# Patient Record
Sex: Female | Born: 1990 | Race: White | Hispanic: No | Marital: Single | Smoking: Never smoker
Health system: Southern US, Community
[De-identification: ages and names within clinical notes are randomized; demographics above are authoritative.]

## PROBLEM LIST (undated history)

## (undated) HISTORY — PX: WISDOM TOOTH EXTRACTION: SHX21

---

## 2020-03-05 ENCOUNTER — Other Ambulatory Visit: Payer: Self-pay

## 2020-03-05 ENCOUNTER — Emergency Department: Payer: Medicaid Other

## 2020-03-05 ENCOUNTER — Emergency Department
Admission: EM | Admit: 2020-03-05 | Discharge: 2020-03-05 | Disposition: A | Discharge status: Home / Self Care | Payer: Medicaid Other | Attending: Emergency Medicine | Admitting: Emergency Medicine

## 2020-03-05 ENCOUNTER — Encounter: Payer: Self-pay | Admitting: Emergency Medicine

## 2020-03-05 DIAGNOSIS — M25511 Pain in right shoulder: Secondary | ICD-10-CM

## 2020-03-05 DIAGNOSIS — W06XXXA Fall from bed, initial encounter: Secondary | ICD-10-CM | POA: Insufficient documentation

## 2020-03-05 DIAGNOSIS — S0990XA Unspecified injury of head, initial encounter: Secondary | ICD-10-CM | POA: Diagnosis not present

## 2020-03-05 DIAGNOSIS — S4991XA Unspecified injury of right shoulder and upper arm, initial encounter: Secondary | ICD-10-CM | POA: Diagnosis present

## 2020-03-05 DIAGNOSIS — S42021A Displaced fracture of shaft of right clavicle, initial encounter for closed fracture: Secondary | ICD-10-CM | POA: Insufficient documentation

## 2020-03-05 MED ORDER — ONDANSETRON 4 MG PO TBDP: 4.0000 mg | ORAL_TABLET | Freq: Three times a day (TID) | ORAL | 0 refills | Status: AC | PRN

## 2020-03-05 MED ORDER — OXYCODONE-ACETAMINOPHEN 5-325 MG PO TABS: 1.0000 | ORAL_TABLET | Freq: Four times a day (QID) | ORAL | 0 refills | Status: AC | PRN

## 2020-03-05 NOTE — ED Triage Notes (Signed)
Pt to ED via POV, ambulatory to triage desk with steady gait. Pt reports fell at approx 0300, c/o R shoulder pain at this time. Pt also states hit her face when she fell. Pt denies visual changes from fall. Pt denies LOC from fall.

## 2020-03-05 NOTE — ED Provider Notes (Signed)
[facility]-EMERGENCY DEPARTMENT  ____________________________________________  Time seen: Approximately 10:46 AM  I have reviewed the triage vital signs and the nursing notes.   HISTORY  Chief Complaint Fall and Shoulder Injury (/)   Historian Patient     HPI Kerri Wright is a 30 y.o. female presents to the emergency department with right shoulder pain and headache.  Patient reports that her and her husband were [place] to the bathroom last night and she fell out of bed and hit her head on the concrete floor.  She sustained a subconjunctival hemorrhage on the left.  Patient is also complaining of right shoulder pain over the right clavicle.  She denies loss of consciousness.  No neck pain.  No numbness or tingling in the upper and lower extremities.  No chest pain, chest tightness or abdominal pain.  Patient has been able to ambulate since injury occurred but has difficulty performing overhead activities for the right upper extremity.   History reviewed. No pertinent past medical history.   Immunizations up to date:  Yes.     History reviewed. No pertinent past medical history.  There are no problems to display for this patient.   Past Surgical History:  Procedure Laterality Date  . CESAREAN SECTION    . WISDOM TOOTH EXTRACTION      Prior to Admission medications   Not on File    Allergies Vicodin hp [hydrocodone-acetaminophen]  No family history on file.  Social History Social History   Tobacco Use  . Smoking status: Never Smoker  . Smokeless tobacco: Never Used  Substance Use Topics  . Alcohol use: Yes  . Drug use: Not Currently     Review of Systems  Constitutional: No fever/chills Eyes:  No discharge ENT: No upper respiratory complaints. Respiratory: no cough. No SOB/ use of accessory muscles to breath Gastrointestinal:   No nausea, no vomiting.  No diarrhea.  No constipation. Musculoskeletal: Patient has right shoulder pain.  Skin: Negative for  rash, abrasions, lacerations, ecchymosis.    ____________________________________________   PHYSICAL EXAM:  VITAL SIGNS: ED Triage Vitals  Enc Vitals Group     BP 03/05/20 0853 120/85     Pulse Rate 03/05/20 0853 98     Resp 03/05/20 0853 18     Temp 03/05/20 0853 98.3 F (36.8 C)     Temp Source 03/05/20 0853 Oral     SpO2 03/05/20 0853 98 %     Weight 03/05/20 0846 170 lb (77.1 kg)     Height 03/05/20 0846 5\' 2"  (1.575 m)     Head Circumference --      Peak Flow --      Pain Score 03/05/20 0846 7     Pain Loc --      Pain Edu? --      Excl. in GC? --      Constitutional: Alert and oriented. Well appearing and in no acute distress. Eyes: Patient has subconjunctival hemorrhage on the left.  PERRL. EOMI. Head: Atraumatic. ENT:      Nose: No congestion/rhinnorhea.      Mouth/Throat: Mucous membranes are moist.  Neck: No stridor.  Full range of motion.  No midline C-spine tenderness to palpation. Cardiovascular: Normal rate, regular rhythm. Normal S1 and S2.  Good peripheral circulation. Respiratory: Normal respiratory effort without tachypnea or retractions. Lungs CTAB. Good air entry to the bases with no decreased or absent breath sounds Gastrointestinal: Bowel sounds x 4 quadrants. Soft and nontender to palpation. No guarding or rigidity.  No distention. Musculoskeletal: Patient is unable to perform full range of motion at the right shoulder.  She does have tenderness to palpation over the right clavicle.  Palpable radial pulse, right.  Patient can spread her right fingers can perform an okay sign.  Neurologic:  Normal for age. No gross focal neurologic deficits are appreciated.  Skin:  Skin is warm, dry and intact. No rash noted. Psychiatric: Mood and affect are normal for age. Speech and behavior are normal.   ____________________________________________   LABS (all labs ordered are listed, but only abnormal results are displayed)  Labs Reviewed - No data to  display ____________________________________________  EKG   ____________________________________________  RADIOLOGY Geraldo Pitter, personally viewed and evaluated these images (plain radiographs) as part of my medical decision making, as well as reviewing the written report by the radiologist.  DG Shoulder Right  Result Date: 03/05/2020 CLINICAL DATA:  Fall while running.  Right shoulder pain. EXAM: RIGHT SHOULDER - 2+ VIEW COMPARISON:  None. FINDINGS: Right shoulder is located without a fracture. There appears to be a mildly displaced fracture involving the mid right clavicle. Visualized ribs are intact. IMPRESSION: Mildly displaced fracture involving the right clavicle. This may be better characterized with dedicated clavicle images. No acute abnormality to the right shoulder. Electronically Signed   By: Richarda Overlie M.D.   On: 03/05/2020 09:42   CT Head Wo Contrast  Result Date: 03/05/2020 CLINICAL DATA:  30 year old female status post fall at 0300 hours. Pain. Sinuses EXAM: CT HEAD WITHOUT CONTRAST TECHNIQUE: Contiguous axial images were obtained from the base of the skull through the vertex without intravenous contrast. COMPARISON:  None. FINDINGS: Brain: Normal noncontrast CT appearance of the brain. Normal cerebral volume. No midline shift, ventriculomegaly, mass effect, evidence of mass lesion, intracranial hemorrhage or evidence of cortically based acute infarction. Gray-white matter differentiation is within normal limits throughout the brain. Vascular: No suspicious intracranial vascular hyperdensity. Skull: Negative.  No fracture identified. Sinuses/Orbits: Visualized paranasal sinuses and mastoids are clear. Other: No orbit or scalp soft tissue injury identified. IMPRESSION: Normal noncontrast Head CT.   No acute traumatic injury identified. Electronically Signed   By: Odessa Fleming M.D.   On: 03/05/2020 10:31     ____________________________________________    PROCEDURES  Procedure(s) performed:     Procedures     Medications - No data to display   ____________________________________________   INITIAL IMPRESSION / ASSESSMENT AND PLAN / ED COURSE  Pertinent labs & imaging results that were available during my care of the patient were reviewed by me and considered in my medical decision making (see chart for details).      Assessment and plan Right shoulder pain 30 year old female presents to the emergency department with acute right shoulder pain and headache after patient fell from her bed.  Vital signs were reassuring at triage.  On physical exam, patient was alert, active and nontoxic-appearing with no neuro deficits.  She did have tenderness to palpation over the right clavicle.  X-ray of the right shoulder indicates a nondisplaced right clavicle fracture that is mildly angulated.  Patient was placed in a sling for comfort.  CT head reveals no evidence of intracranial bleed or skull fracture.  Patient was prescribed a short course of Percocet for pain.     ____________________________________________  FINAL CLINICAL IMPRESSION(S) / ED DIAGNOSES  Final diagnoses:  Acute pain of right shoulder      NEW MEDICATIONS STARTED DURING THIS VISIT:  ED Discharge Orders  None          This chart was dictated using voice recognition software/Dragon. Despite best efforts to proofread, errors can occur which can change the meaning. Any change was purely unintentional.     Orvil Feil, PA-C 03/05/20 1053    Minna Antis, MD 03/05/20 [phone]

## 2022-05-30 IMAGING — CT CT HEAD W/O CM
4 series · 16 of 47 positions shown, 18 images · non-contrast
Comparison: None.

CLINICAL DATA: 29-year-old female status post fall at 3633 hours.
Pain. Sinuses

EXAM:
CT HEAD WITHOUT CONTRAST
TECHNIQUE: Contiguous axial images were obtained from the base of the skull
through the vertex without intravenous contrast.

[Series 2: head bone · axial · 0.40mm/px · z∈[-128,-98]mm · 3 of 75 slices shown]
[im 8/75  bone]
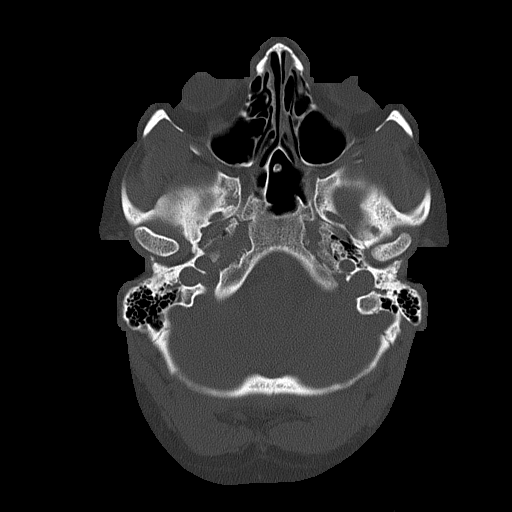
[im 15/75  bone]
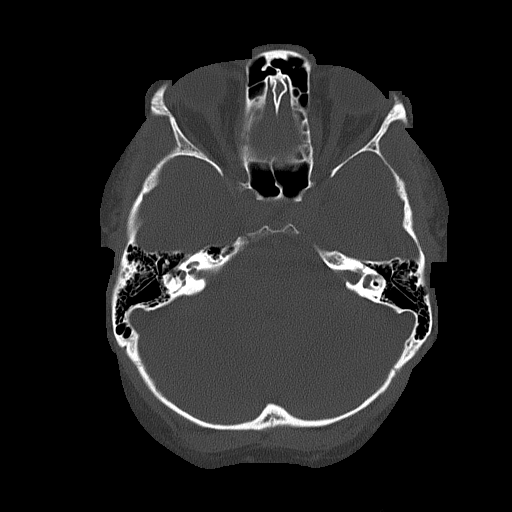
[im 23/75  bone]
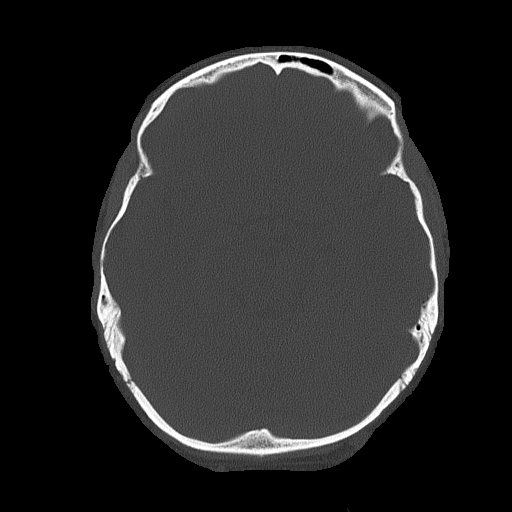

[Series 3: head wo · axial · 0.40mm/px · z∈[-127,-17]mm · 7 of 30 slices shown, 9 images]
[im 4/30  brain]
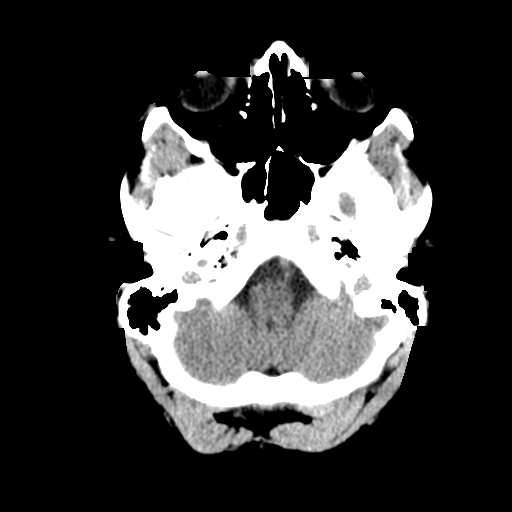
[im 4/30  bone]
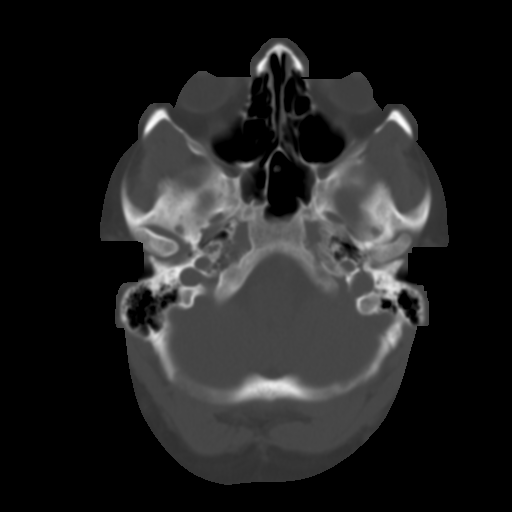
[im 8/30  brain]
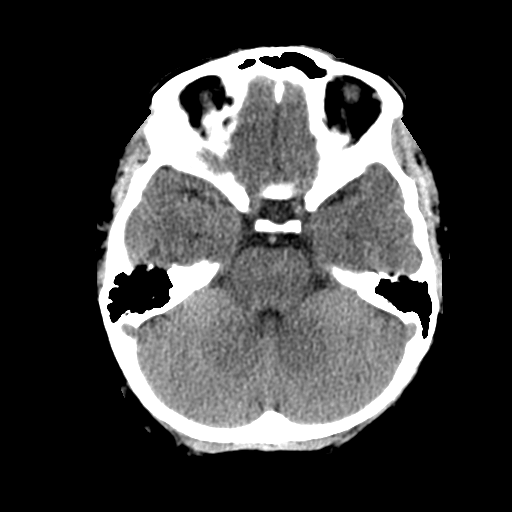
[im 11/30  brain]
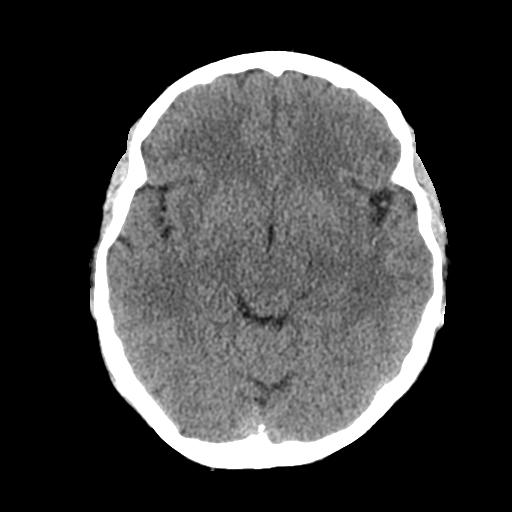
[im 15/30  brain]
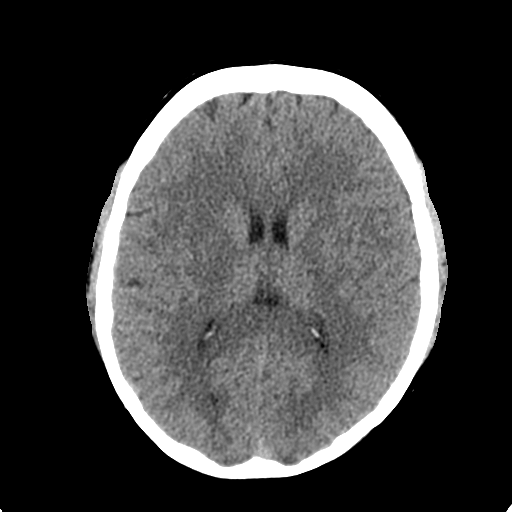
[im 19/30  brain]
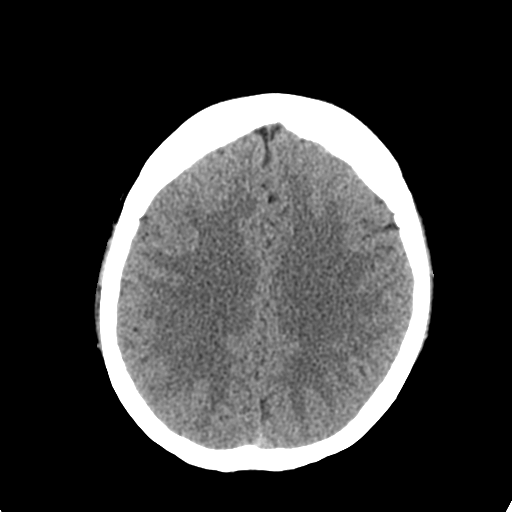
[im 19/30  bone]
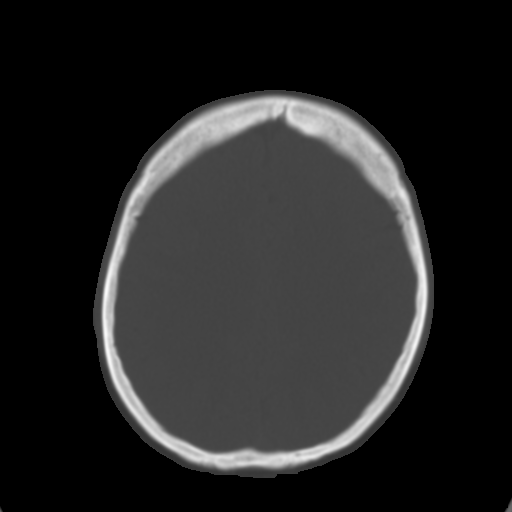
[im 22/30  brain]
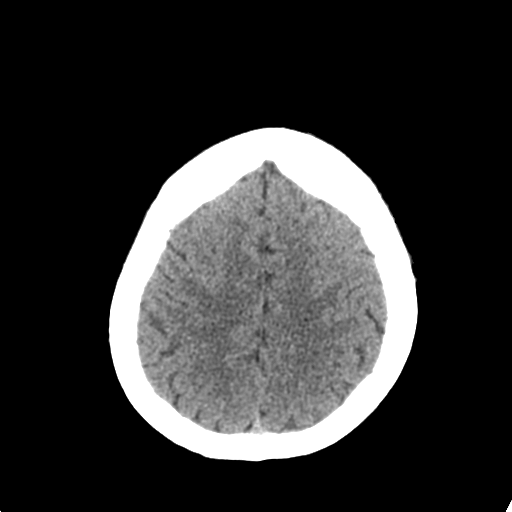
[im 26/30  brain]
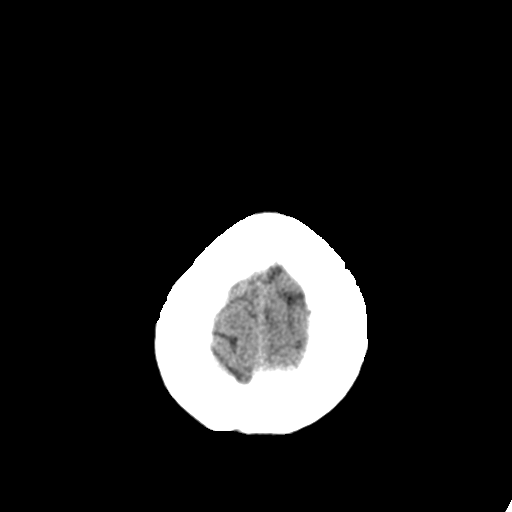

[Series 4: coronal soft tissue · coronal · 0.30mm/px · 3 of 60 slices shown]
[im 20/60  brain]
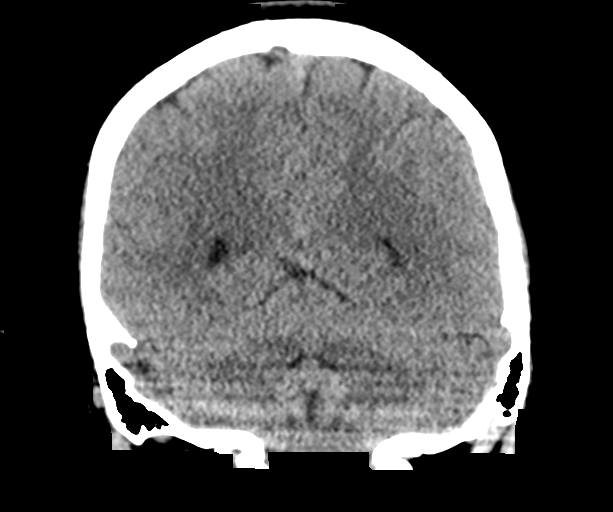
[im 27/60  brain]
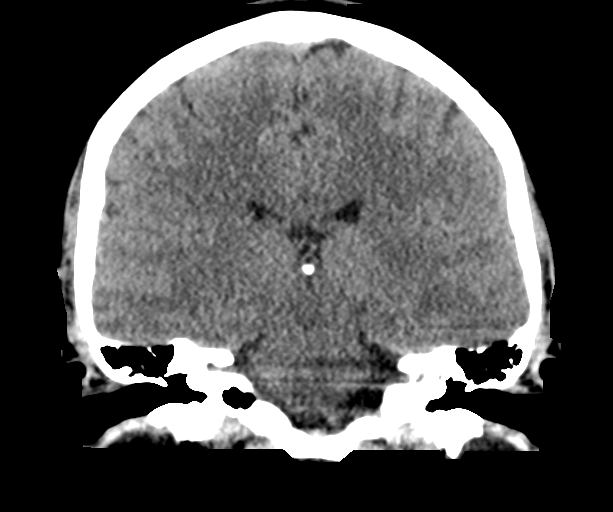
[im 33/60  brain]
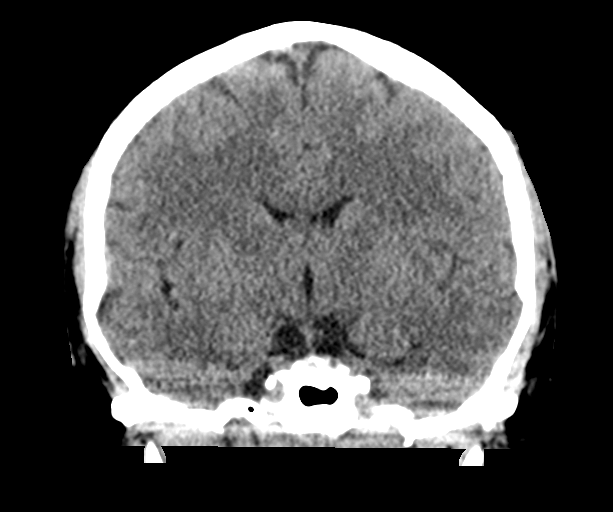

[Series 5: sagittal soft tissue · sagittal · 0.30mm/px · 3 of 54 slices shown]
[im 18/54  brain]
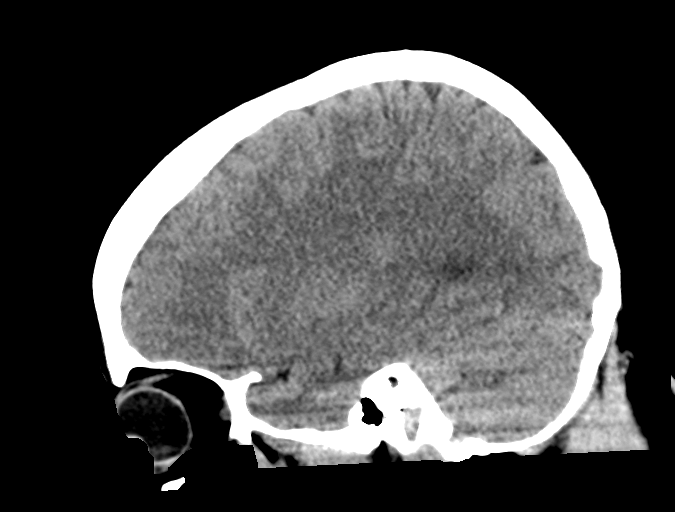
[im 27/54  brain]
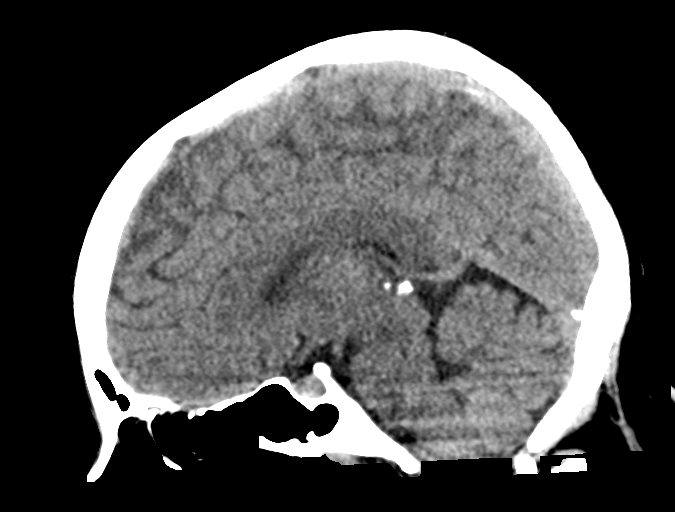
[im 36/54  brain]
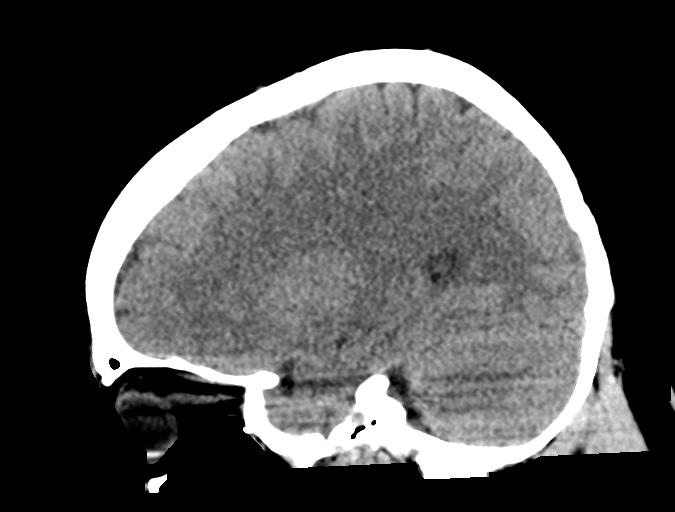

[16 of 47 positions shown; findings below may reference images not displayed]

FINDINGS: Brain: Normal noncontrast CT appearance of the brain. Normal
cerebral volume. No midline shift, ventriculomegaly, mass effect,
evidence of mass lesion, intracranial hemorrhage or evidence of
cortically based acute infarction. Gray-white matter differentiation
is within normal limits throughout the brain.

Vascular: No suspicious intracranial vascular hyperdensity.

Skull: Negative.  No fracture identified.

Sinuses/Orbits: Visualized paranasal sinuses and mastoids are clear.

Other: No orbit or scalp soft tissue injury identified.
IMPRESSION: Normal noncontrast Head CT.   No acute traumatic injury identified.
